# Patient Record
Sex: Female | Born: 1975 | Race: White | Hispanic: No | Marital: Single | Smoking: Current every day smoker
Health system: Southern US, Community
[De-identification: ages and names within clinical notes are randomized; demographics above are authoritative.]

## PROBLEM LIST (undated history)

## (undated) DIAGNOSIS — F329 Major depressive disorder, single episode, unspecified: Secondary | ICD-10-CM

## (undated) DIAGNOSIS — F1990 Other psychoactive substance use, unspecified, uncomplicated: Secondary | ICD-10-CM

## (undated) DIAGNOSIS — F32A Depression, unspecified: Secondary | ICD-10-CM

## (undated) HISTORY — PX: CHOLECYSTECTOMY: SHX55

## (undated) HISTORY — PX: TUBAL LIGATION: SHX77

## (undated) HISTORY — PX: DILATION AND CURETTAGE OF UTERUS: SHX78

## (undated) HISTORY — DX: Depression, unspecified: F32.A

## (undated) HISTORY — PX: KNEE ARTHROSCOPY: SUR90

---
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
## 2008-07-07 HISTORY — PX: BACK SURGERY: SHX140

## 2019-03-24 ENCOUNTER — Emergency Department: Payer: Self-pay

## 2019-03-24 ENCOUNTER — Other Ambulatory Visit: Payer: Self-pay

## 2019-03-24 ENCOUNTER — Emergency Department
Admission: EM | Admit: 2019-03-24 | Discharge: 2019-03-24 | Disposition: A | Discharge status: Home / Self Care | Payer: Self-pay | Attending: Emergency Medicine | Admitting: Emergency Medicine

## 2019-03-24 DIAGNOSIS — F17200 Nicotine dependence, unspecified, uncomplicated: Secondary | ICD-10-CM | POA: Insufficient documentation

## 2019-03-24 DIAGNOSIS — M7551 Bursitis of right shoulder: Secondary | ICD-10-CM | POA: Insufficient documentation

## 2019-03-24 MED ORDER — NAPROXEN 500 MG PO TABS: 500.0000 mg | ORAL_TABLET | Freq: Two times a day (BID) | ORAL | Status: DC

## 2019-03-24 MED ORDER — LIDOCAINE 5 % EX PTCH
1.0000 | MEDICATED_PATCH | CUTANEOUS | Status: DC
  Administered 2019-03-24: 21:00:00 1 via TRANSDERMAL
  Filled 2019-03-24: qty 1

## 2019-03-24 NOTE — ED Notes (Signed)
Pt with right shoulder pain that is gradually getting worse per pt. Pt has difficulty and pain lifting arm up. Pt denies injury or fall.

## 2019-03-24 NOTE — ED Provider Notes (Signed)
[facility] Emergency Department Provider Note   ____________________________________________   First MD Initiated Contact with Patient 03/24/19 1922     (approximate)  I have reviewed the triage vital signs and the nursing notes.   HISTORY  Chief Complaint Shoulder Pain    HPI Gina Murphy is a 43 y.o. female patient complain of week and a half or right shoulder pain located to [facility].  Patient states she has had a long history of right shoulder pain for history of trauma.  Patient believes secondary to overuse.  Patient that this first time she is worked in 2 years when she started a new job a week and a half ago.  Patient the pain increases with overhead reaching and abduction.  Patient rates the pain as a 9/10.  Patient described the pain as "achy".  No palliative measure for complaint.         History reviewed. No pertinent past medical history.  There are no active problems to display for this patient.   History reviewed. No pertinent surgical history.  Prior to Admission medications   Medication Sig Start Date End Date Taking? Authorizing Provider  naproxen (NAPROSYN) 500 MG tablet Take 1 tablet (500 mg total) by mouth 2 (two) times daily with a meal. 03/24/19   Joni ReiningSmith, Zakye Baby K, PA-C    Allergies Patient has no known allergies.  No family history on file.  Social History Social History   Tobacco Use   Smoking status: Current Every Day Smoker  Substance Use Topics   Alcohol use: Not Currently   Drug use: Not on file    Review of Systems Constitutional: No fever/chills Eyes: No visual changes. ENT: No sore throat. Cardiovascular: Denies chest pain. Respiratory: Denies shortness of breath. Gastrointestinal: No abdominal pain.  No nausea, no vomiting.  No diarrhea.  No constipation. Genitourinary: Negative for dysuria. Musculoskeletal: Right shoulder pain.   Skin: Negative for rash. Neurological: Negative for  headaches, focal weakness or numbness.   ____________________________________________   PHYSICAL EXAM:  VITAL SIGNS: ED Triage Vitals [03/24/19 1822]  Enc Vitals Group     BP (!) 112/92     Pulse Rate 81     Resp 18     Temp 98.9 F (37.2 C)     Temp Source Oral     SpO2 100 %     Weight 165 lb (74.8 kg)     Height 5\' 8"  (1.727 m)     Head Circumference      Peak Flow      Pain Score 9     Pain Loc      Pain Edu?      Excl. in GC?    Constitutional: Alert and oriented. Well appearing and in no acute distress. Hematological/Lymphatic/Immunilogical: No cervical lymphadenopathy. Cardiovascular: Normal rate, regular rhythm. Grossly normal heart sounds.  Good peripheral circulation. Respiratory: Normal respiratory effort.  No retractions. Lungs CTAB. Musculoskeletal: No obvious deformity to the right shoulder.  Patient decreased range of motion with abduction and overhead reaching.  Moderate guarding palpation GH joint.  Neurologic:  Normal speech and language. No gross focal neurologic deficits are appreciated. No gait instability. Skin:  Skin is warm, dry and intact. No rash noted. Psychiatric: Mood and affect are normal. Speech and behavior are normal.  ____________________________________________   LABS (all labs ordered are listed, but only abnormal results are displayed)  Labs Reviewed - No data to display ____________________________________________  EKG   ____________________________________________  RADIOLOGY  ED MD interpretation:    Official radiology report(s): Dg Shoulder Right  Result Date: 03/24/2019 CLINICAL DATA:  Chronic shoulder pain EXAM: RIGHT SHOULDER - 2+ VIEW COMPARISON:  None. FINDINGS: There is no evidence of fracture or dislocation. There is no evidence of arthropathy or other focal bone abnormality. Soft tissues are unremarkable. IMPRESSION: Negative. Electronically Signed   By: Donavan Foil M.D.   On: 03/24/2019 20:11     ____________________________________________   PROCEDURES  Procedure(s) performed (including Critical Care):  Procedures   ____________________________________________   INITIAL IMPRESSION / ASSESSMENT AND PLAN / ED COURSE  As part of my medical decision making, I reviewed the following data within the [place] was evaluated in Emergency Department on 03/24/2019 for the symptoms described in the history of present illness. She was evaluated in the context of the global COVID-19 pandemic, which necessitated consideration that the patient might be at risk for infection with the SARS-CoV-2 virus that causes COVID-19. Institutional protocols and algorithms that pertain to the evaluation of patients at risk for COVID-19 are in a state of rapid change based on information released by regulatory bodies including the CDC and federal and state organizations. These policies and algorithms were followed during the patient's care in the ED.     Patient presents with 1/2-week of right shoulder pain.  Discussed negative strep findings with patient.  Patient physical exam consistent with bursitis.  Patient given discharge care instruction and advised to follow-up with orthopedic if no improvement in 5 to 7 days.   ____________________________________________   FINAL CLINICAL IMPRESSION(S) / ED DIAGNOSES  Final diagnoses:  Bursitis of shoulder, right     ED Discharge Orders         Ordered    naproxen (NAPROSYN) 500 MG tablet  2 times daily with meals     03/24/19 2036           Note:  This document was prepared using Dragon voice recognition software and may include unintentional dictation errors.    Sable Feil, PA-C 03/24/19 2039    Vanessa Norco, MD 03/25/19 1723

## 2019-03-24 NOTE — ED Triage Notes (Signed)
Hx of right shoulder injury with right shoulder pain due to overuse. Reports she recently started back work and has overused right shoulder, pain with movement. Pain alleviated with rest. Pt unable to stay out of work to allow rest to shoulder. Pt alert and oriented X4, cooperative, RR even and unlabored, color WNL. Pt in NAD. Denies CP or SOB

## 2019-05-07 ENCOUNTER — Emergency Department
Admission: EM | Admit: 2019-05-07 | Discharge: 2019-05-07 | Disposition: A | Discharge status: Home / Self Care | Payer: Self-pay | Attending: Emergency Medicine | Admitting: Emergency Medicine

## 2019-05-07 ENCOUNTER — Other Ambulatory Visit: Payer: Self-pay

## 2019-05-07 ENCOUNTER — Encounter: Payer: Self-pay | Admitting: Emergency Medicine

## 2019-05-07 DIAGNOSIS — R1110 Vomiting, unspecified: Secondary | ICD-10-CM | POA: Insufficient documentation

## 2019-05-07 DIAGNOSIS — Z5321 Procedure and treatment not carried out due to patient leaving prior to being seen by health care provider: Secondary | ICD-10-CM | POA: Insufficient documentation

## 2019-05-07 HISTORY — DX: Other psychoactive substance use, unspecified, uncomplicated: F19.90

## 2019-05-07 LAB — URINALYSIS, COMPLETE (UACMP) WITH MICROSCOPIC
Bacteria, UA: NONE SEEN
Bilirubin Urine: NEGATIVE
Glucose, UA: NEGATIVE mg/dL
Hgb urine dipstick: NEGATIVE
Ketones, ur: 20 mg/dL — AB
Leukocytes,Ua: NEGATIVE
Nitrite: NEGATIVE
Protein, ur: NEGATIVE mg/dL
Specific Gravity, Urine: 1.017 (ref 1.005–1.030)
pH: 7 (ref 5.0–8.0)

## 2019-05-07 LAB — POCT PREGNANCY, URINE: Preg Test, Ur: NEGATIVE

## 2019-05-07 MED ORDER — SODIUM CHLORIDE 0.9% FLUSH: 3.0000 mL | Freq: Once | INTRAVENOUS | Status: DC

## 2019-05-07 MED ORDER — ONDANSETRON 4 MG PO TBDP
4.0000 mg | ORAL_TABLET | Freq: Once | ORAL | Status: AC
  Administered 2019-05-07: 4 mg via ORAL

## 2019-05-07 MED ORDER — ONDANSETRON 4 MG PO TBDP
ORAL_TABLET | ORAL | Status: AC
  Filled 2019-05-07: qty 1

## 2019-05-07 NOTE — ED Triage Notes (Signed)
Called no answer

## 2019-05-07 NOTE — ED Triage Notes (Signed)
Nausea, vomiting and diarrhea since 2 am. Lower abdominal pain.

## 2019-05-07 NOTE — ED Notes (Signed)
First Nurse Note: Pt to ED stating that she has been vomiting since earlier this morning and having diarrhea. Pt vomited x 1 during registration. Pt is in NAD.

## 2019-05-07 NOTE — ED Notes (Signed)
Nurse and tech attempt to draw blood unsuccessful. 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 Oral zofran ordered.

## 2019-05-09 ENCOUNTER — Telehealth: Payer: Self-pay | Admitting: Emergency Medicine

## 2019-05-09 NOTE — Telephone Encounter (Signed)
Called patient due to lwot to inquire about condition and follow up plans.  Disconnected after she answered.

## 2019-08-08 DIAGNOSIS — S2239XA Fracture of one rib, unspecified side, initial encounter for closed fracture: Secondary | ICD-10-CM

## 2019-08-08 DIAGNOSIS — S36039A Unspecified laceration of spleen, initial encounter: Secondary | ICD-10-CM

## 2019-08-08 DIAGNOSIS — S2249XA Multiple fractures of ribs, unspecified side, initial encounter for closed fracture: Secondary | ICD-10-CM

## 2019-08-08 HISTORY — DX: Unspecified laceration of spleen, initial encounter: S36.039A

## 2019-08-08 HISTORY — DX: Fracture of one rib, unspecified side, initial encounter for closed fracture: S22.39XA

## 2019-08-08 HISTORY — DX: Multiple fractures of ribs, unspecified side, initial encounter for closed fracture: S22.49XA

## 2019-08-09 ENCOUNTER — Emergency Department
Admission: EM | Admit: 2019-08-09 | Discharge: 2019-08-09 | Disposition: A | Discharge status: Other Acute Inpatient Hospital | Payer: Self-pay | Attending: Emergency Medicine | Admitting: Emergency Medicine

## 2019-08-09 ENCOUNTER — Encounter (HOSPITAL_COMMUNITY): Payer: Self-pay | Admitting: Emergency Medicine

## 2019-08-09 ENCOUNTER — Inpatient Hospital Stay (HOSPITAL_COMMUNITY): Payer: Self-pay

## 2019-08-09 ENCOUNTER — Inpatient Hospital Stay (HOSPITAL_COMMUNITY)
Admission: EM | Admit: 2019-08-09 | Discharge: 2019-08-11 | DRG: 814 | Disposition: A | Discharge status: Home / Self Care | Payer: Self-pay | Source: Other Acute Inpatient Hospital | Attending: General Surgery | Admitting: General Surgery

## 2019-08-09 ENCOUNTER — Other Ambulatory Visit: Payer: Self-pay

## 2019-08-09 ENCOUNTER — Emergency Department: Payer: Self-pay

## 2019-08-09 DIAGNOSIS — Z9049 Acquired absence of other specified parts of digestive tract: Secondary | ICD-10-CM | POA: Diagnosis not present

## 2019-08-09 DIAGNOSIS — F17200 Nicotine dependence, unspecified, uncomplicated: Secondary | ICD-10-CM | POA: Insufficient documentation

## 2019-08-09 DIAGNOSIS — Y92410 Unspecified street and highway as the place of occurrence of the external cause: Secondary | ICD-10-CM

## 2019-08-09 DIAGNOSIS — R1012 Left upper quadrant pain: Secondary | ICD-10-CM | POA: Diagnosis present

## 2019-08-09 DIAGNOSIS — S36039A Unspecified laceration of spleen, initial encounter: Principal | ICD-10-CM | POA: Diagnosis present

## 2019-08-09 DIAGNOSIS — K661 Hemoperitoneum: Secondary | ICD-10-CM | POA: Insufficient documentation

## 2019-08-09 DIAGNOSIS — Y9389 Activity, other specified: Secondary | ICD-10-CM | POA: Diagnosis not present

## 2019-08-09 DIAGNOSIS — R911 Solitary pulmonary nodule: Secondary | ICD-10-CM | POA: Diagnosis present

## 2019-08-09 DIAGNOSIS — D62 Acute posthemorrhagic anemia: Secondary | ICD-10-CM | POA: Diagnosis present

## 2019-08-09 DIAGNOSIS — N179 Acute kidney failure, unspecified: Secondary | ICD-10-CM | POA: Diagnosis present

## 2019-08-09 DIAGNOSIS — Z9851 Tubal ligation status: Secondary | ICD-10-CM

## 2019-08-09 DIAGNOSIS — Y9289 Other specified places as the place of occurrence of the external cause: Secondary | ICD-10-CM | POA: Diagnosis not present

## 2019-08-09 DIAGNOSIS — Z20822 Contact with and (suspected) exposure to covid-19: Secondary | ICD-10-CM | POA: Diagnosis present

## 2019-08-09 DIAGNOSIS — F1911 Other psychoactive substance abuse, in remission: Secondary | ICD-10-CM | POA: Diagnosis present

## 2019-08-09 DIAGNOSIS — Y999 Unspecified external cause status: Secondary | ICD-10-CM | POA: Insufficient documentation

## 2019-08-09 DIAGNOSIS — S2232XA Fracture of one rib, left side, initial encounter for closed fracture: Secondary | ICD-10-CM | POA: Insufficient documentation

## 2019-08-09 DIAGNOSIS — S40022A Contusion of left upper arm, initial encounter: Secondary | ICD-10-CM | POA: Diagnosis present

## 2019-08-09 DIAGNOSIS — M79672 Pain in left foot: Secondary | ICD-10-CM | POA: Diagnosis present

## 2019-08-09 DIAGNOSIS — S3991XA Unspecified injury of abdomen, initial encounter: Secondary | ICD-10-CM | POA: Diagnosis present

## 2019-08-09 LAB — HIV ANTIBODY (ROUTINE TESTING W REFLEX): HIV Screen 4th Generation wRfx: NONREACTIVE

## 2019-08-09 LAB — CBC WITH DIFFERENTIAL/PLATELET
Abs Immature Granulocytes: 0.06 10*3/uL (ref 0.00–0.07)
Basophils Absolute: 0 10*3/uL (ref 0.0–0.1)
Basophils Relative: 0 %
Eosinophils Absolute: 0 10*3/uL (ref 0.0–0.5)
Eosinophils Relative: 0 %
HCT: 33 % — ABNORMAL LOW (ref 36.0–46.0)
Hemoglobin: 11.2 g/dL — ABNORMAL LOW (ref 12.0–15.0)
Immature Granulocytes: 0 %
Lymphocytes Relative: 5 %
Lymphs Abs: 0.8 10*3/uL (ref 0.7–4.0)
MCH: 29.9 pg (ref 26.0–34.0)
MCHC: 33.9 g/dL (ref 30.0–36.0)
MCV: 88 fL (ref 80.0–100.0)
Monocytes Absolute: 0.7 10*3/uL (ref 0.1–1.0)
Monocytes Relative: 4 %
Neutro Abs: 14.5 10*3/uL — ABNORMAL HIGH (ref 1.7–7.7)
Neutrophils Relative %: 91 %
Platelets: 197 10*3/uL (ref 150–400)
RBC: 3.75 MIL/uL — ABNORMAL LOW (ref 3.87–5.11)
RDW: 12.8 % (ref 11.5–15.5)
WBC: 16.1 10*3/uL — ABNORMAL HIGH (ref 4.0–10.5)
nRBC: 0 % (ref 0.0–0.2)

## 2019-08-09 LAB — RESPIRATORY PANEL BY RT PCR (FLU A&B, COVID)
Influenza A by PCR: NEGATIVE
Influenza B by PCR: NEGATIVE
SARS Coronavirus 2 by RT PCR: NEGATIVE

## 2019-08-09 LAB — BASIC METABOLIC PANEL
Anion gap: 14 (ref 5–15)
BUN: 22 mg/dL — ABNORMAL HIGH (ref 6–20)
CO2: 25 mmol/L (ref 22–32)
Calcium: 9.1 mg/dL (ref 8.9–10.3)
Chloride: 97 mmol/L — ABNORMAL LOW (ref 98–111)
Creatinine, Ser: 2.5 mg/dL — ABNORMAL HIGH (ref 0.44–1.00)
GFR calc Af Amer: 27 mL/min — ABNORMAL LOW (ref >60)
GFR calc non Af Amer: 23 mL/min — ABNORMAL LOW (ref >60)
Glucose, Bld: 186 mg/dL — ABNORMAL HIGH (ref 70–99)
Potassium: 4.8 mmol/L (ref 3.5–5.1)
Sodium: 136 mmol/L (ref 135–145)

## 2019-08-09 LAB — TYPE AND SCREEN
ABO/RH(D): O POS
ABO/RH(D): O POS
Antibody Screen: NEGATIVE
Antibody Screen: NEGATIVE

## 2019-08-09 LAB — POCT PREGNANCY, URINE: Preg Test, Ur: NEGATIVE

## 2019-08-09 LAB — CBC
HCT: 24.8 % — ABNORMAL LOW (ref 36.0–46.0)
Hemoglobin: 8.5 g/dL — ABNORMAL LOW (ref 12.0–15.0)
MCH: 30.6 pg (ref 26.0–34.0)
MCHC: 34.3 g/dL (ref 30.0–36.0)
MCV: 89.2 fL (ref 80.0–100.0)
Platelets: 125 10*3/uL — ABNORMAL LOW (ref 150–400)
RBC: 2.78 MIL/uL — ABNORMAL LOW (ref 3.87–5.11)
RDW: 12.9 % (ref 11.5–15.5)
WBC: 9.9 10*3/uL (ref 4.0–10.5)
nRBC: 0 % (ref 0.0–0.2)

## 2019-08-09 LAB — URINALYSIS, ROUTINE W REFLEX MICROSCOPIC
Bilirubin Urine: NEGATIVE
Glucose, UA: NEGATIVE mg/dL
Hgb urine dipstick: NEGATIVE
Ketones, ur: NEGATIVE mg/dL
Leukocytes,Ua: NEGATIVE
Nitrite: NEGATIVE
Protein, ur: 30 mg/dL — AB
Specific Gravity, Urine: 1.02 (ref 1.005–1.030)
pH: 5 (ref 5.0–8.0)

## 2019-08-09 LAB — HEPATIC FUNCTION PANEL
ALT: 25 U/L (ref 0–44)
AST: 37 U/L (ref 15–41)
Albumin: 4.2 g/dL (ref 3.5–5.0)
Alkaline Phosphatase: 80 U/L (ref 38–126)
Bilirubin, Direct: <0.1 mg/dL (ref 0.0–0.2)
Total Bilirubin: 0.6 mg/dL (ref 0.3–1.2)
Total Protein: 7.9 g/dL (ref 6.5–8.1)

## 2019-08-09 LAB — APTT: aPTT: 28 seconds (ref 24–36)

## 2019-08-09 LAB — ABO/RH: ABO/RH(D): O POS

## 2019-08-09 LAB — MRSA PCR SCREENING: MRSA by PCR: NEGATIVE

## 2019-08-09 LAB — HCG, QUANTITATIVE, PREGNANCY: hCG, Beta Chain, Quant, S: <1 m[IU]/mL (ref <5)

## 2019-08-09 LAB — PROTIME-INR
INR: 1 (ref 0.8–1.2)
Prothrombin Time: 13 seconds (ref 11.4–15.2)

## 2019-08-09 LAB — CK: Total CK: 66 U/L (ref 38–234)

## 2019-08-09 MED ORDER — ONDANSETRON HCL 4 MG/2ML IJ SOLN
4.0000 mg | Freq: Four times a day (QID) | INTRAMUSCULAR | Status: DC | PRN
  Administered 2019-08-10 (×2): 4 mg via INTRAVENOUS
  Filled 2019-08-09 (×2): qty 2

## 2019-08-09 MED ORDER — OXYCODONE HCL 5 MG PO TABS
10.0000 mg | ORAL_TABLET | ORAL | Status: DC | PRN
  Administered 2019-08-09 – 2019-08-10 (×3): 10 mg via ORAL
  Filled 2019-08-09 (×3): qty 2

## 2019-08-09 MED ORDER — PIPERACILLIN-TAZOBACTAM 3.375 G IVPB 30 MIN
3.3750 g | Freq: Once | INTRAVENOUS | Status: AC
  Administered 2019-08-09: 3.375 g via INTRAVENOUS
  Filled 2019-08-09: qty 50

## 2019-08-09 MED ORDER — HYDROMORPHONE HCL 1 MG/ML IJ SOLN
1.0000 mg | INTRAMUSCULAR | Status: DC | PRN
  Administered 2019-08-09: 1 mg via INTRAVENOUS
  Filled 2019-08-09: qty 1

## 2019-08-09 MED ORDER — HYDROMORPHONE HCL 1 MG/ML IJ SOLN
1.0000 mg | Freq: Once | INTRAMUSCULAR | Status: AC
  Administered 2019-08-09: 1 mg via INTRAVENOUS
  Filled 2019-08-09: qty 1

## 2019-08-09 MED ORDER — ONDANSETRON 4 MG PO TBDP
4.0000 mg | ORAL_TABLET | Freq: Four times a day (QID) | ORAL | Status: DC | PRN
  Administered 2019-08-11: 4 mg via ORAL
  Filled 2019-08-09: qty 1

## 2019-08-09 MED ORDER — HYDROMORPHONE HCL 1 MG/ML IJ SOLN
1.0000 mg | INTRAMUSCULAR | Status: DC | PRN
  Administered 2019-08-10 – 2019-08-11 (×2): 1 mg via INTRAVENOUS
  Filled 2019-08-09 (×2): qty 1

## 2019-08-09 MED ORDER — METHOCARBAMOL 750 MG PO TABS
750.0000 mg | ORAL_TABLET | Freq: Three times a day (TID) | ORAL | Status: DC
  Administered 2019-08-09 – 2019-08-11 (×6): 750 mg via ORAL
  Filled 2019-08-09 (×6): qty 1

## 2019-08-09 MED ORDER — ONDANSETRON HCL 4 MG/2ML IJ SOLN
4.0000 mg | Freq: Once | INTRAMUSCULAR | Status: AC
  Administered 2019-08-09: 12:00:00 4 mg via INTRAVENOUS
  Filled 2019-08-09: qty 2

## 2019-08-09 MED ORDER — METOPROLOL TARTRATE 5 MG/5ML IV SOLN: 5.0000 mg | Freq: Four times a day (QID) | INTRAVENOUS | Status: DC | PRN

## 2019-08-09 MED ORDER — OXYCODONE HCL 5 MG PO TABS: 5.0000 mg | ORAL_TABLET | ORAL | Status: DC | PRN

## 2019-08-09 MED ORDER — SODIUM CHLORIDE 0.9 % IV BOLUS
1000.0000 mL | Freq: Once | INTRAVENOUS | Status: AC
  Administered 2019-08-09: 1000 mL via INTRAVENOUS

## 2019-08-09 MED ORDER — ACETAMINOPHEN 325 MG PO TABS
650.0000 mg | ORAL_TABLET | Freq: Four times a day (QID) | ORAL | Status: DC
  Administered 2019-08-09 – 2019-08-11 (×7): 650 mg via ORAL
  Filled 2019-08-09 (×7): qty 2

## 2019-08-09 MED ORDER — POTASSIUM CHLORIDE IN NACL 20-0.9 MEQ/L-% IV SOLN
INTRAVENOUS | Status: DC
  Filled 2019-08-09 (×2): qty 1000

## 2019-08-09 MED ORDER — SODIUM CHLORIDE 0.9 % IV BOLUS: 1000.0000 mL | Freq: Once | INTRAVENOUS | Status: DC
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
## 2019-08-09 NOTE — ED Notes (Signed)
FAST exam done per [place] by Dr. Fuller Plan at bedside.
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
## 2019-08-09 NOTE — ED Notes (Signed)
Ambulatory to toilet with steady gait to urinate into hat to collect urine sample.
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
## 2019-08-10 LAB — BASIC METABOLIC PANEL
Anion gap: 13 (ref 5–15)
BUN: 12 mg/dL (ref 6–20)
CO2: 24 mmol/L (ref 22–32)
Calcium: 8.4 mg/dL — ABNORMAL LOW (ref 8.9–10.3)
Chloride: 102 mmol/L (ref 98–111)
Creatinine, Ser: 0.96 mg/dL (ref 0.44–1.00)
GFR calc Af Amer: >60 mL/min (ref >60)
GFR calc non Af Amer: >60 mL/min (ref >60)
Glucose, Bld: 100 mg/dL — ABNORMAL HIGH (ref 70–99)
Potassium: 4.4 mmol/L (ref 3.5–5.1)
Sodium: 139 mmol/L (ref 135–145)

## 2019-08-10 LAB — CBC
HCT: 22.4 % — ABNORMAL LOW (ref 36.0–46.0)
HCT: 23 % — ABNORMAL LOW (ref 36.0–46.0)
Hemoglobin: 7.4 g/dL — ABNORMAL LOW (ref 12.0–15.0)
Hemoglobin: 7.8 g/dL — ABNORMAL LOW (ref 12.0–15.0)
MCH: 30.3 pg (ref 26.0–34.0)
MCH: 30.4 pg (ref 26.0–34.0)
MCHC: 33 g/dL (ref 30.0–36.0)
MCHC: 33.9 g/dL (ref 30.0–36.0)
MCV: 91.8 fL (ref 80.0–100.0)
MCV: 89.5 fL (ref 80.0–100.0)
Platelets: 104 10*3/uL — ABNORMAL LOW (ref 150–400)
Platelets: 102 10*3/uL — ABNORMAL LOW (ref 150–400)
RBC: 2.44 MIL/uL — ABNORMAL LOW (ref 3.87–5.11)
RBC: 2.57 MIL/uL — ABNORMAL LOW (ref 3.87–5.11)
RDW: 13.1 % (ref 11.5–15.5)
RDW: 12.9 % (ref 11.5–15.5)
WBC: 5.8 10*3/uL (ref 4.0–10.5)
WBC: 6.2 10*3/uL (ref 4.0–10.5)
nRBC: 0 % (ref 0.0–0.2)
nRBC: 0 % (ref 0.0–0.2)

## 2019-08-10 MED ORDER — VITAMIN C 500 MG/5ML PO SYRP
500.0000 mg | ORAL_SOLUTION | Freq: Three times a day (TID) | ORAL | Status: DC
  Administered 2019-08-10 (×2): 500 mg via ORAL
  Filled 2019-08-10 (×3): qty 5

## 2019-08-10 MED ORDER — LACTATED RINGERS IV SOLN: INTRAVENOUS | Status: DC

## 2019-08-10 MED ORDER — FERROUS SULFATE 300 (60 FE) MG/5ML PO SYRP
300.0000 mg | ORAL_SOLUTION | Freq: Three times a day (TID) | ORAL | Status: DC
  Administered 2019-08-10 – 2019-08-11 (×3): 300 mg via ORAL
  Filled 2019-08-10 (×3): qty 5

## 2019-08-10 MED ORDER — OXYCODONE HCL 5 MG PO TABS
5.0000 mg | ORAL_TABLET | ORAL | Status: DC | PRN
  Administered 2019-08-10 – 2019-08-11 (×4): 10 mg via ORAL
  Filled 2019-08-10 (×4): qty 2
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
## 2019-08-11 ENCOUNTER — Encounter (HOSPITAL_COMMUNITY): Payer: Self-pay | Admitting: General Surgery

## 2019-08-11 LAB — CBC
HCT: 22.4 % — ABNORMAL LOW (ref 36.0–46.0)
Hemoglobin: 7.7 g/dL — ABNORMAL LOW (ref 12.0–15.0)
MCH: 30.7 pg (ref 26.0–34.0)
MCHC: 34.4 g/dL (ref 30.0–36.0)
MCV: 89.2 fL (ref 80.0–100.0)
Platelets: 95 10*3/uL — ABNORMAL LOW (ref 150–400)
RBC: 2.51 MIL/uL — ABNORMAL LOW (ref 3.87–5.11)
RDW: 12.5 % (ref 11.5–15.5)
WBC: 5.1 10*3/uL (ref 4.0–10.5)
nRBC: 0 % (ref 0.0–0.2)

## 2019-08-11 LAB — BASIC METABOLIC PANEL
Anion gap: 12 (ref 5–15)
BUN: 7 mg/dL (ref 6–20)
CO2: 25 mmol/L (ref 22–32)
Calcium: 8.7 mg/dL — ABNORMAL LOW (ref 8.9–10.3)
Chloride: 99 mmol/L (ref 98–111)
Creatinine, Ser: 0.78 mg/dL (ref 0.44–1.00)
GFR calc Af Amer: >60 mL/min (ref >60)
GFR calc non Af Amer: >60 mL/min (ref >60)
Glucose, Bld: 103 mg/dL — ABNORMAL HIGH (ref 70–99)
Potassium: 4.4 mmol/L (ref 3.5–5.1)
Sodium: 136 mmol/L (ref 135–145)

## 2019-08-11 LAB — MAGNESIUM: Magnesium: 1.6 mg/dL — ABNORMAL LOW (ref 1.7–2.4)

## 2019-08-11 LAB — PHOSPHORUS: Phosphorus: 3.1 mg/dL (ref 2.5–4.6)

## 2019-08-11 MED ORDER — OXYCODONE HCL 5 MG PO TABS
5.0000 mg | ORAL_TABLET | ORAL | Status: DC | PRN
  Administered 2019-08-11 (×2): 10 mg via ORAL
  Filled 2019-08-11 (×2): qty 2

## 2019-08-11 MED ORDER — METHOCARBAMOL 500 MG PO TABS
1000.0000 mg | ORAL_TABLET | Freq: Three times a day (TID) | ORAL | Status: DC
  Administered 2019-08-11: 1000 mg via ORAL
  Filled 2019-08-11: qty 2

## 2019-08-11 MED ORDER — TRAMADOL HCL 50 MG PO TABS: 50.0000 mg | ORAL_TABLET | ORAL | Status: DC | PRN

## 2019-08-11 MED ORDER — METHOCARBAMOL 500 MG PO TABS: 1000.0000 mg | ORAL_TABLET | Freq: Three times a day (TID) | ORAL | 0 refills | Status: AC | PRN

## 2019-08-11 MED ORDER — ONDANSETRON 4 MG PO TBDP: 4.0000 mg | ORAL_TABLET | Freq: Four times a day (QID) | ORAL | 0 refills | Status: DC | PRN

## 2019-08-11 MED ORDER — ACETAMINOPHEN 500 MG PO TABS: 1000.0000 mg | ORAL_TABLET | Freq: Three times a day (TID) | ORAL | 0 refills | Status: AC | PRN

## 2019-08-11 MED ORDER — DOCUSATE SODIUM 100 MG PO CAPS: 100.0000 mg | ORAL_CAPSULE | Freq: Two times a day (BID) | ORAL | 2 refills | Status: AC | PRN

## 2019-08-11 MED ORDER — OXYCODONE HCL 5 MG PO TABS: 5.0000 mg | ORAL_TABLET | Freq: Four times a day (QID) | ORAL | 0 refills | Status: AC | PRN

## 2019-08-11 MED ORDER — ASCORBIC ACID 500 MG PO TABS
500.0000 mg | ORAL_TABLET | Freq: Three times a day (TID) | ORAL | Status: DC
  Administered 2019-08-11: 500 mg via ORAL
  Filled 2019-08-11: qty 1

## 2019-08-11 MED ORDER — PROMETHAZINE HCL 25 MG/ML IJ SOLN
12.5000 mg | Freq: Four times a day (QID) | INTRAMUSCULAR | Status: DC | PRN
  Administered 2019-08-11: 25 mg via INTRAVENOUS
  Filled 2019-08-11: qty 1

## 2019-08-11 MED ORDER — ACETAMINOPHEN 500 MG PO TABS
1000.0000 mg | ORAL_TABLET | Freq: Four times a day (QID) | ORAL | Status: DC
  Administered 2019-08-11: 1000 mg via ORAL
  Filled 2019-08-11: qty 2

## 2019-08-11 MED ORDER — MAGNESIUM SULFATE 2 GM/50ML IV SOLN
2.0000 g | Freq: Once | INTRAVENOUS | Status: AC
  Administered 2019-08-11: 2 g via INTRAVENOUS
  Filled 2019-08-11: qty 50

## 2019-08-11 MED ORDER — CENTRUM PO CHEW: 1.0000 | CHEWABLE_TABLET | Freq: Every day | ORAL | 0 refills | Status: AC

## 2019-08-11 MED FILL — CERTAVITE/ANTIOXIDANTS TABS: 30 days supply | Qty: 30 | Fill #0

## 2019-08-11 MED FILL — ONDANSETRON ODT 4 MG TABLET: 4 | 5 days supply | Qty: 20 | Fill #0

## 2019-08-11 MED FILL — oxyCODONE HCL 5 MG TABS: 5 | 4 days supply | Qty: 15 | Fill #0

## 2019-08-11 MED FILL — METHOCARBAMOL 500 MG TABS: 500 | 10 days supply | Qty: 60 | Fill #0
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
## 2019-08-11 NOTE — Discharge Instructions (Signed)
Please do not lift anything greater than 10lbs for the next 6 weeks. Please avoid any activities that could cause trauma to your abdomen (I.e sports, riding a bike, etc).   Splenic Injury  A splenic injury is an injury of the spleen. The spleen is an organ located in the upper left area of the abdomen, just under the ribs. The spleen filters and cleans the blood. It also stores blood cells and destroys cells that are worn out. The spleen also plays an important role in fighting disease. Splenic injuries can vary. In some cases, the spleen may only be bruised, with some bleeding inside the covering and around the spleen. Splenic injuries may also cause a deep tear or cut into the spleen (lacerated spleen). Some splenic injuries can cause the spleen to break open (rupture). What are the causes? This condition may be caused by a direct blow (trauma) to the spleen. Trauma can result from:  Car accidents.  Contact sports.  Falls.  Penetrating injuries. These can be caused by gunshot wounds or sharp objects such as a knife. What increases the risk? You may be at greater risk for a splenic injury if you have a disease that can cause the spleen to become enlarged. These include:  Alcoholic liver disease.  Viral infections, especially mononucleosis.  Certain inflammatory diseases, such as lupus.  Certain cancers, especially those that involve the lymphatic system.  Cystic fibrosis. What are the signs or symptoms? Symptoms of this condition depend on the severity of the injury. A minor injury often causes no symptoms or only minor pain in the abdomen. A major injury can result in severe bleeding, causing your blood pressure to decrease rapidly. This in turn will cause symptoms such as:  Dizziness or light-headedness.  Rapid heart rate.  Difficulty breathing.  Fainting.  Sweating with clammy skin. Other symptoms of a splenic injury may include:  Very bad abdominal pain.  Pain in the  left shoulder.  Pain when the abdomen is pressed (tenderness).  Nausea.  Swelling or bruising of the abdomen. How is this diagnosed? This condition may be diagnosed based on:  Your symptoms and medical history, especially if you were recently in an accident or you recently got hurt.  A physical exam.  Imaging tests, such as: ? CT scan. ? Ultrasound. You may have frequent blood tests for a few days after the injury to monitor your condition. How is this treated? Treatment depends on the type of splenic injury you have and how bad it is.  Less severe injuries may be treated with: ? Observation. ? Interventional radiology. This involves using flexible tubes (catheters) to stop the bleeding from inside the blood vessel.  More severe injuries may require hospitalization in the intensive care unit (ICU). While you are in the hospital: ? Your fluid and blood levels will be monitored closely. ? You will get fluids through an IV as needed. ? You may need follow-up scans to check whether your spleen is able to heal itself. If the injury is getting worse, you may need surgery. ? You may receive donated blood (transfusion). ? You may have a long needle inserted into your abdomen to remove any blood that has collected inside the spleen (hematoma).  If your blood pressure is too low, you may need emergency surgery. This may include: ? Repairing a laceration. ? Removing part of the spleen. ? Removing the entire spleen (splenectomy). Follow these instructions at home: Activity  Rest as told by your health  care provider.  Avoid sitting for a long time without moving. Get up to take short walks every 1-2 hours. This is important to improve blood flow and breathing. Ask for help if you feel weak or unsteady.  Do not participate in any activity that takes a lot of effort until your health care provider says that it is safe.  Do not take part in contact sports until your health care provider  says it is safe to do so.  Do not lift anything that is heavier than 10 lb (4.5 kg), or the limit that you are told, until your health care provider says that it is safe. General instructions  Take over-the-counter and prescription medicines only as told by your health care provider.  Stay up-to-date on vaccines as told by your health care provider.  Follow instructions from your health care provider about eating or drinking restrictions.  Do not drink alcohol.  Do not use any products that contain nicotine or tobacco, such as cigarettes and e-cigarettes. These may delay healing after an injury. If you need help quitting, ask your health care provider.  Keep all follow-up visits as told by your health care provider. This is important. Get help right away if you have:  A fever.  New or increasing pain in your abdomen or in your left shoulder.  Signs or symptoms of internal bleeding. Watch for: ? Sweating. ? Dizziness. ? Weakness. ? Cold and clammy skin. ? Fainting.  Chest pain or difficulty breathing. Summary  The spleen is an organ located in the upper left area of the abdomen, just under the ribs.  A splenic injury is an injury of the spleen. This may be caused by a direct blow (trauma) to the spleen.  You may be at greater risk for a splenic injury if you have a disease that can cause the spleen to become enlarged.  A minor injury often causes no symptoms or only minor pain in the abdomen. A major injury can result in severe bleeding, causing your blood pressure to decrease rapidly.  Treatment depends on the type of splenic injury you have and how bad it is. This information is not intended to replace advice given to you by your health care provider. Make sure you discuss any questions you have with your health care provider. Document Revised: 09/04/2017 Document Reviewed: 06/17/2017 Elsevier Patient Education  2020 Elsevier Inc[employer]  RIB FRACTURES  HOME  INSTRUCTIONS   1. PAIN CONTROL:  1. Pain is best controlled by a usual combination of three different methods TOGETHER:  i. Ice/Heat ii. Over the counter pain medication iii. Prescription pain medication 2. You may experience some swelling and bruising in area of broken ribs. Ice packs or heating pads (30-60 minutes up to 6 times a day) will help. Use ice for the first few days to help decrease swelling and bruising, then switch to heat to help relax tight/sore spots and speed recovery. Some people prefer to use ice alone, heat alone, alternating between ice & heat. Experiment to what works for you. Swelling and bruising can take several weeks to resolve.  3. It is helpful to take an over-the-counter pain medication regularly for the first few weeks. Choose one of the following that works best for you:  i. Naproxen (Aleve, etc) Two 220mg  tabs twice a day ii. Ibuprofen (Advil, etc) Three 200mg  tabs four times a day (every meal & bedtime) iii. Acetaminophen (Tylenol, etc) 500-650mg  four times a day (every meal & bedtime) 4.  A prescription for pain medication (such as oxycodone, hydrocodone, etc) may be given to you upon discharge. Take your pain medication as prescribed.  i. If you are having problems/concerns with the prescription medicine (does not control pain, nausea, vomiting, rash, itching, etc), please call us [phone] to see if we need to switch you to a different pain medicine that will work better for you and/or control your side effect better. ii. If you need a refill on your pain medication, please contact your pharmacy. They will contact our office to request authorization. Prescriptions will not be filled after 5 pm or on week-ends. 1. Avoid getting constipated. When taking pain medications, it is common to experience some constipation. Increasing fluid intake and taking a fiber supplement (such as Metamucil, Citrucel, FiberCon, MiraLax, etc) 1-2 times a day regularly will usually  help prevent this problem from occurring. A mild laxative (prune juice, Milk of Magnesia, MiraLax, etc) should be taken according to package directions if there are no bowel movements after 48 hours.  2. Watch out for diarrhea. If you have many loose bowel movements, simplify your diet to bland foods & liquids for a few days. Stop any stool softeners and decrease your fiber supplement. Switching to mild anti-diarrheal medications (Kayopectate, Pepto Bismol) can help. If this worsens or does not improve, please call us. 3. FOLLOW UP  a. If a follow up appointment is needed one will be scheduled for you. If none is needed with our trauma team, please follow up with your primary care provider within 2-3 weeks from discharge. Please call [facility] at [phone] if you have any questions about follow up.  b. If you have any orthopedic or other injuries you will need to follow up as outlined in your follow up instructions.   WHEN TO CALL us [phone]:  1. Poor pain control 2. Reactions / problems with new medications (rash/itching, nausea, etc)  3. Fever over 101.5 F (38.5 C) 4. Worsening swelling or bruising 5. Worsening pain, productive cough, difficulty breathing or any other concerning symptoms  The clinic staff is available to answer your questions during regular business hours (8:30am-5pm). Please don't hesitate to call and ask to speak to one of our nurses for clinical concerns.  If you have a medical emergency, go to the nearest emergency room or call 911.  A surgeon from [facility] is always on call at the [facility], [place], [place], [place], [place] [address] ?  MAIN: [phone] ? TOLL FREE: [phone] ?  FAX [phone]  [email or web address]      Information on Rib Fractures  A rib fracture is a break or crack in one of the bones of the ribs. The ribs are long, curved bones that wrap around your chest  and attach to your spine and your breastbone. The ribs protect your heart, lungs, and other organs in the chest. A broken or cracked rib is often painful but is not usually serious. Most rib fractures heal on their own over time. However, rib fractures can be more serious if multiple ribs are broken or if broken ribs move out of place and push against other structures or organs. What are the causes? This condition is caused by:  Repetitive movements with high force, such as pitching a baseball or having severe coughing spells.  A direct blow to the chest, such as a sports injury, a car accident, or a fall.  Cancer  that has spread to the bones, which can weaken bones and cause them to break. What are the signs or symptoms? Symptoms of this condition include:  Pain when you breathe in or cough.  Pain when someone presses on the injured area.  Feeling short of breath. How is this diagnosed? This condition is diagnosed with a physical exam and medical history. Imaging tests may also be done, such as:  Chest X-ray.  CT scan.  MRI.  Bone scan.  Chest ultrasound. How is this treated? Treatment for this condition depends on the severity of the fracture. Most rib fractures usually heal on their own in 1-3 months. Sometimes healing takes longer if there is a cough that does not stop or if there are other activities that make the injury worse (aggravating factors). While you heal, you will be given medicines to control the pain. You will also be taught deep breathing exercises. Severe injuries may require hospitalization or surgery. Follow these instructions at home: Managing pain, stiffness, and swelling  If directed, apply ice to the injured area. ? Put ice in a plastic bag. ? Place a towel between your skin and the bag. ? Leave the ice on for 20 minutes, 2-3 times a day.  Take over-the-counter and prescription medicines only as told by your health care provider. Activity  Avoid a  lot of activity and any activities or movements that cause pain. Be careful during activities and avoid bumping the injured rib.  Slowly increase your activity as told by your health care provider. General instructions  Do deep breathing exercises as told by your health care provider. This helps prevent pneumonia, which is a common complication of a broken rib. Your health care provider may instruct you to: ? Take deep breaths several times a day. ? Try to cough several times a day, holding a pillow against the injured area. ? Use a device called incentive spirometer to practice deep breathing several times a day.  Drink enough fluid to keep your urine pale yellow.  Do not wear a rib belt or binder. These restrict breathing, which can lead to pneumonia.  Keep all follow-up visits as told by your health care provider. This is important. Contact a health care provider if:  You have a fever. Get help right away if:  You have difficulty breathing or you are short of breath.  You develop a cough that does not stop, or you cough up thick or bloody sputum.  You have nausea, vomiting, or pain in your abdomen.  Your pain gets worse and medicine does not help. Summary  A rib fracture is a break or crack in one of the bones of the ribs.  A broken or cracked rib is often painful but is not usually serious.  Most rib fractures heal on their own over time.  Treatment for this condition depends on the severity of the fracture.  Avoid a lot of activity and any activities or movements that cause pain. This information is not intended to replace advice given to you by your health care provider. Make sure you discuss any questions you have with your health care provider. Document Released: 06/23/2005 Document Revised: 09/22/2016 Document Reviewed: 09/22/2016 Elsevier Interactive Patient Education  2019 [employer].  The CT scan of your chest did show a 2 mm right lower lobe lung nodule. They  recommend that you have a non-contrast chest CT in 12 months for monitoring. Please follow up with your primary care physician for this.

## 2019-08-11 NOTE — TOC Transition Note (Signed)
Transition of Care [facility]) - CM/SW Discharge Note   Patient Details  Name: [name] MRN: [id number] Date of Birth: [birth year]  Transition of [facility]) CM/SW Contact:  Glennon Mac, RN Phone Number: 08/11/2019, 3:50 PM   Clinical Narrative:    Pt medically stable for discharge home with roommate.  Meds filled through [facility] pharmacy using MATCH letter.  Pt to follow up at [facility] for PCP/hospital follow up on 08/17/19 at 2:30, and appt info placed on AVS.     Final next level of care: Home/Self Care Barriers to Discharge: Barriers Resolved                       Discharge Plan and Services   Discharge Planning Services: [facility], Follow-up appt scheduled                                 Social Determinants of Health (SDOH) Interventions     Readmission Risk Interventions Readmission Risk Prevention Plan 08/10/2019  Post Dischage Appt Complete  Medication Screening Complete  Transportation Screening Complete   Quintella Baton, RN, BSN  Trauma/Neuro ICU Case Manager [phone]

## 2019-08-11 NOTE — Progress Notes (Signed)
Discharge instructions given to patient by this nurse. Medications delivered by [facility] pharmacy. Pt verbally confirmed understanding of instructions to both. PIV removed from LFA. Transported out via wc to POV with mother. Mayford Knife RN

## 2019-08-17 ENCOUNTER — Ambulatory Visit: Payer: Self-pay | Attending: Nurse Practitioner | Admitting: Nurse Practitioner

## 2019-08-17 ENCOUNTER — Encounter: Payer: Self-pay | Admitting: Nurse Practitioner

## 2019-08-17 ENCOUNTER — Other Ambulatory Visit: Payer: Self-pay

## 2019-08-17 DIAGNOSIS — X58XXXD Exposure to other specified factors, subsequent encounter: Secondary | ICD-10-CM

## 2019-08-17 DIAGNOSIS — S36039D Unspecified laceration of spleen, subsequent encounter: Secondary | ICD-10-CM

## 2019-08-17 DIAGNOSIS — M791 Myalgia, unspecified site: Secondary | ICD-10-CM

## 2019-08-17 DIAGNOSIS — Z131 Encounter for screening for diabetes mellitus: Secondary | ICD-10-CM

## 2019-08-17 DIAGNOSIS — Z09 Encounter for follow-up examination after completed treatment for conditions other than malignant neoplasm: Secondary | ICD-10-CM

## 2019-08-17 DIAGNOSIS — Z13220 Encounter for screening for lipoid disorders: Secondary | ICD-10-CM

## 2019-08-17 DIAGNOSIS — F418 Other specified anxiety disorders: Secondary | ICD-10-CM

## 2019-08-17 DIAGNOSIS — Z7689 Persons encountering health services in other specified circumstances: Secondary | ICD-10-CM

## 2019-08-17 DIAGNOSIS — R112 Nausea with vomiting, unspecified: Secondary | ICD-10-CM

## 2019-08-17 MED ORDER — ONDANSETRON 4 MG PO TBDP: 4.0000 mg | ORAL_TABLET | Freq: Three times a day (TID) | ORAL | 0 refills | Status: AC | PRN
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
## 2019-09-01 ENCOUNTER — Other Ambulatory Visit: Payer: Self-pay
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
